# Patient Record
Sex: Male | Born: 1980 | Race: Black or African American | Hispanic: No | Marital: Single | Smoking: Never smoker
Health system: Southern US, Community
[De-identification: ages and names within clinical notes are randomized; demographics above are authoritative.]

## PROBLEM LIST (undated history)

## (undated) DIAGNOSIS — B259 Cytomegaloviral disease, unspecified: Secondary | ICD-10-CM

## (undated) DIAGNOSIS — D696 Thrombocytopenia, unspecified: Secondary | ICD-10-CM

## (undated) DIAGNOSIS — R161 Splenomegaly, not elsewhere classified: Secondary | ICD-10-CM

## (undated) DIAGNOSIS — B1910 Unspecified viral hepatitis B without hepatic coma: Secondary | ICD-10-CM

## (undated) HISTORY — DX: Cytomegaloviral disease, unspecified: B25.9

## (undated) HISTORY — DX: Unspecified viral hepatitis B without hepatic coma: B19.10

## (undated) HISTORY — DX: Thrombocytopenia, unspecified: D69.6

## (undated) HISTORY — DX: Splenomegaly, not elsewhere classified: R16.1

---

## 2015-04-26 ENCOUNTER — Ambulatory Visit: Payer: Self-pay | Admitting: Family Medicine

## 2015-04-29 ENCOUNTER — Ambulatory Visit: Payer: Medicaid Other | Attending: Family Medicine | Admitting: Family Medicine

## 2015-04-29 ENCOUNTER — Encounter: Payer: Self-pay | Admitting: Family Medicine

## 2015-04-29 ENCOUNTER — Other Ambulatory Visit: Payer: Self-pay | Admitting: Family Medicine

## 2015-04-29 VITALS — BP 101/61 | HR 86 | Temp 99.0°F | Resp 16 | Ht 71.0 in | Wt 156.0 lb

## 2015-04-29 DIAGNOSIS — J322 Chronic ethmoidal sinusitis: Secondary | ICD-10-CM | POA: Insufficient documentation

## 2015-04-29 DIAGNOSIS — M609 Myositis, unspecified: Secondary | ICD-10-CM | POA: Insufficient documentation

## 2015-04-29 DIAGNOSIS — R682 Dry mouth, unspecified: Secondary | ICD-10-CM | POA: Insufficient documentation

## 2015-04-29 DIAGNOSIS — IMO0001 Reserved for inherently not codable concepts without codable children: Secondary | ICD-10-CM

## 2015-04-29 DIAGNOSIS — B1910 Unspecified viral hepatitis B without hepatic coma: Secondary | ICD-10-CM | POA: Insufficient documentation

## 2015-04-29 DIAGNOSIS — Z131 Encounter for screening for diabetes mellitus: Secondary | ICD-10-CM

## 2015-04-29 DIAGNOSIS — M791 Myalgia: Secondary | ICD-10-CM

## 2015-04-29 DIAGNOSIS — G43909 Migraine, unspecified, not intractable, without status migrainosus: Secondary | ICD-10-CM | POA: Insufficient documentation

## 2015-04-29 DIAGNOSIS — Z0000 Encounter for general adult medical examination without abnormal findings: Secondary | ICD-10-CM | POA: Insufficient documentation

## 2015-04-29 DIAGNOSIS — B181 Chronic viral hepatitis B without delta-agent: Secondary | ICD-10-CM | POA: Diagnosis not present

## 2015-04-29 DIAGNOSIS — J0120 Acute ethmoidal sinusitis, unspecified: Secondary | ICD-10-CM | POA: Diagnosis not present

## 2015-04-29 DIAGNOSIS — K029 Dental caries, unspecified: Secondary | ICD-10-CM

## 2015-04-29 DIAGNOSIS — Z1159 Encounter for screening for other viral diseases: Secondary | ICD-10-CM

## 2015-04-29 LAB — COMPLETE METABOLIC PANEL WITH GFR
ALBUMIN: 3.6 g/dL (ref 3.6–5.1)
ALT: 37 U/L (ref 9–46)
AST: 38 U/L (ref 10–40)
Alkaline Phosphatase: 83 U/L (ref 40–115)
BUN: 9 mg/dL (ref 7–25)
CHLORIDE: 103 mmol/L (ref 98–110)
CO2: 28 mmol/L (ref 20–31)
Calcium: 8.9 mg/dL (ref 8.6–10.3)
Creat: 0.72 mg/dL (ref 0.60–1.35)
GFR, Est African American: >89 mL/min (ref >=60)
GFR, Est Non African American: >89 mL/min (ref >=60)
GLUCOSE: 106 mg/dL — AB (ref 65–99)
POTASSIUM: 4.9 mmol/L (ref 3.5–5.3)
SODIUM: 136 mmol/L (ref 135–146)
Total Bilirubin: 1.8 mg/dL — ABNORMAL HIGH (ref 0.2–1.2)
Total Protein: 7.3 g/dL (ref 6.1–8.1)

## 2015-04-29 LAB — HEPATITIS B SURFACE ANTIBODY, QUANTITATIVE: Hepatitis B-Post: 0 m[IU]/mL

## 2015-04-29 LAB — HIV ANTIBODY (ROUTINE TESTING W REFLEX): HIV: NONREACTIVE

## 2015-04-29 LAB — POCT GLYCOSYLATED HEMOGLOBIN (HGB A1C): HEMOGLOBIN A1C: 4.4

## 2015-04-29 LAB — HEPATITIS B SURFACE AG-PRE VC IMM ST: Hepatitis B Pre Screen: REACTIVE — AB

## 2015-04-29 LAB — HEPATITIS A ANTIBODY, IGM: HEP A IGM: NONREACTIVE

## 2015-04-29 MED ORDER — AMOXICILLIN 500 MG PO CAPS: 500.0000 mg | ORAL_CAPSULE | Freq: Three times a day (TID) | ORAL | Status: DC

## 2015-04-29 NOTE — Progress Notes (Signed)
Subjective:  Patient ID: Julian Cox, male    DOB: [birth year]  Age: 35 y.o. MRN: [id number]  CC: Establish Care; Migraine; Eye Pain; and Joint Pain   HPI Atem Wal easy 35 year old male [place] male who comes into the clinic stating he has the Flu. He informs me he self diagnosed himself due to symptoms of body aches, rhinorrhea, left-sided headache and eye pain which feels like "thumping" for the last 3 weeks but he never got tested or treated. He also complains of [place] and mild abdominal discomfort. He has had associated fever and chills which is subjective with no diarrhea, vomiting or constipation. Denies sore throat or postnasal drip but complains of dry mouth.  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 He received the flu shot in November 2016.  He would also like to be referred to a dentist due to the fact that with the filing fell out from one of his left upper premolars.  No outpatient prescriptions prior to visit.   No facility-administered medications prior to visit.    ROS Review of Systems  Constitutional: Negative for activity change and appetite change.  HENT: Positive for dental problem. Negative for sinus pressure and sore throat.   Eyes: Negative for visual disturbance.  Respiratory: Negative for cough, chest tightness and shortness of breath.   Cardiovascular: Negative for chest pain and leg swelling.  Gastrointestinal: Negative for abdominal pain, diarrhea, constipation and abdominal distention.  Endocrine: Negative.   Genitourinary: Negative for dysuria.  Musculoskeletal: Negative for myalgias and joint swelling.  Skin: Negative for rash.  Allergic/Immunologic: Negative.   Neurological: Negative for weakness, light-headedness and numbness.  Psychiatric/Behavioral: Negative for suicidal ideas and dysphoric mood.    Objective:  BP 101/61 mmHg  Pulse 86   Temp(Src) 99 F (37.2 C) (Oral)  Resp 16  Ht  (1.803 m)  Wt 156 lb (70.761 kg)  BMI 21.77 kg/m2  SpO2 99%  BP/Weight 04/29/2015  Systolic BP 101  Diastolic BP 61  Wt. (Lbs) 156  BMI 21.77      Physical Exam  Constitutional: He is oriented to person, place, and time. He appears well-developed and well-nourished.  Ill-looking  HENT:  Right Ear: External ear normal.  Left Ear: External ear normal.  Mouth/Throat: No oropharyngeal exudate.  Oropharynx and mouth appeared dry Left-sided frontal and maxillary sinus tenderness  Cardiovascular: Normal rate, normal heart sounds and intact distal pulses.   No murmur heard. Pulmonary/Chest: Effort normal and breath sounds normal. He has no wheezes. He has no rales. He exhibits no tenderness.  Abdominal: Soft. Bowel sounds are normal. He exhibits no distension and no mass. There is tenderness (mild right upper quadrant tenderness).  Musculoskeletal: Normal range of motion.  Neurological: He is alert and oriented to person, place, and time.     Assessment & Plan:   1. Diabetes mellitus screening A1c 4.4-normal - POCT A1C  2. Acute ethmoidal sinusitis, recurrence not specified Treated for sinusitis but we'll need to rule out influenza - amoxicillin (AMOXIL) 500 MG capsule; Take 1 capsule (500 mg total) by mouth 3 (three) times daily.  Dispense: 30 capsule; Refill: 0 - Influenza a and b  3. Chronic viral hepatitis B without delta agent and without coma (HCC) Will need to exclude acute hepatitis given current fatigue We'll also screen for HIV - Hepatitis B E Antigen - Hepatitis B surface ag-pre vc imm st -  Hepatitis B surface antibody - Hepatitis A Antibody, IGM - Cytomegalovirus antibody, IgG - CBC with Differential/Platelet - COMPLETE METABOLIC PANEL WITH GFR  4. Myalgia and myositis Advised to use Tylenol as needed. - Influenza a and b   Meds ordered this encounter  Medications  . amoxicillin (AMOXIL) 500 MG  capsule    Sig: Take 1 capsule (500 mg total) by mouth 3 (three) times daily.    Dispense:  30 capsule    Refill:  0    Follow-up: Return in about 2 weeks (around 05/13/2015) for follow up sinusitis.   Jaclyn Shaggy MD

## 2015-04-29 NOTE — Progress Notes (Signed)
Patient's here to est. care, and discuss recent Dx with hepatitis B.  Patient had the flu, slight fever.  Patient started having HA's and pain on left side of head with throbbing, constant pain in left eye, and some slight pain in right eye.  Patient agree to diabetes screening.

## 2015-04-29 NOTE — Patient Instructions (Signed)

## 2015-04-30 LAB — CBC WITH DIFFERENTIAL/PLATELET
BASOS ABS: 0 10*3/uL (ref 0.0–0.1)
BASOS PCT: 0 % (ref 0–1)
EOS ABS: 0.1 10*3/uL (ref 0.0–0.7)
Eosinophils Relative: 1 % (ref 0–5)
HCT: 40.5 % (ref 39.0–52.0)
HEMOGLOBIN: 14.8 g/dL (ref 13.0–17.0)
LYMPHS ABS: 0.8 10*3/uL (ref 0.7–4.0)
Lymphocytes Relative: 15 % (ref 12–46)
MCH: 28.8 pg (ref 26.0–34.0)
MCHC: 36.5 g/dL — ABNORMAL HIGH (ref 30.0–36.0)
MCV: 78.8 fL (ref 78.0–100.0)
MONOS PCT: 10 % (ref 3–12)
MPV: 9.9 fL (ref 8.6–12.4)
Monocytes Absolute: 0.6 10*3/uL (ref 0.1–1.0)
NEUTROS ABS: 4.1 10*3/uL (ref 1.7–7.7)
NEUTROS PCT: 74 % (ref 43–77)
PLATELETS: 62 10*3/uL — AB (ref 150–400)
RBC: 5.14 MIL/uL (ref 4.22–5.81)
RDW: 14.2 % (ref 11.5–15.5)
WBC: 5.6 10*3/uL (ref 4.0–10.5)

## 2015-04-30 LAB — INFLUENZA A AND B AG, IMMUNOASSAY
Influenza A Antigen: NOT DETECTED
Influenza B Antigen: NOT DETECTED

## 2015-04-30 LAB — CYTOMEGALOVIRUS ANTIBODY, IGG: Cytomegalovirus Ab-IgG: 2.6 U/mL — ABNORMAL HIGH (ref <0.60)

## 2015-05-01 ENCOUNTER — Telehealth: Payer: Self-pay

## 2015-05-01 LAB — HEPATITIS B E ANTIGEN: HEPATITIS BE ANTIGEN: NONREACTIVE

## 2015-05-01 NOTE — Telephone Encounter (Signed)
CMA called patient, patient verified name and DOB. Patient was given lab results including the negative flu results. Per Dr. Venetia Night, patient was transferred to the front for a follow-up visit. Patient verbalized that he understood, with no further questions.

## 2015-05-01 NOTE — Telephone Encounter (Signed)
-----   Message from Jaclyn Shaggy, MD sent at 05/01/2015  2:47 PM EST ----- Negative for HIV, platelet count is low, he tested positive for cytomegalovirus antibodies and does have hepatitis B. I will need him to schedule a follow-up visit so we can discuss his results further and any possible referral he may need.

## 2015-05-08 ENCOUNTER — Ambulatory Visit: Payer: Medicaid Other | Attending: Family Medicine | Admitting: Family Medicine

## 2015-05-08 ENCOUNTER — Encounter: Payer: Self-pay | Admitting: Family Medicine

## 2015-05-08 VITALS — BP 124/82 | HR 70 | Temp 97.7°F | Resp 15 | Ht 71.0 in | Wt 158.0 lb

## 2015-05-08 DIAGNOSIS — R51 Headache: Secondary | ICD-10-CM | POA: Diagnosis not present

## 2015-05-08 DIAGNOSIS — B258 Other cytomegaloviral diseases: Secondary | ICD-10-CM | POA: Diagnosis not present

## 2015-05-08 DIAGNOSIS — D696 Thrombocytopenia, unspecified: Secondary | ICD-10-CM | POA: Insufficient documentation

## 2015-05-08 DIAGNOSIS — B181 Chronic viral hepatitis B without delta-agent: Secondary | ICD-10-CM | POA: Diagnosis not present

## 2015-05-08 DIAGNOSIS — J019 Acute sinusitis, unspecified: Secondary | ICD-10-CM | POA: Insufficient documentation

## 2015-05-08 DIAGNOSIS — B259 Cytomegaloviral disease, unspecified: Secondary | ICD-10-CM | POA: Insufficient documentation

## 2015-05-08 NOTE — Progress Notes (Signed)
Patient states he feels better Reports mild headache today and has 1 more antibiotic pill to take

## 2015-05-08 NOTE — Progress Notes (Signed)
Subjective:  Patient ID: Julian Cox, male    DOB: Jul 10, 1980  Age: 35 y.o. MRN: [id number]  CC: Follow-up   HPI Julian Cox is a 35 year old male originally from [place] with a history of chronic hepatitis B infection, cytomegalovirus infection who was treated for acute sinusitis with amoxicillin and is currently taking his last dose today. He just has a mild residual headache at this time.  His blood work revealed severe thrombocytopenia which the patient was previously unaware of. He also informs me he received treatment for hepatitis B while in [place] for about 2 months but does not have any further details. He will need a note to return to work as he has been out of work due to his illness.  Outpatient Prescriptions Prior to Visit  Medication Sig Dispense Refill  . amoxicillin (AMOXIL) 500 MG capsule Take 1 capsule (500 mg total) by mouth 3 (three) times daily. 30 capsule 0   No facility-administered medications prior to visit.    ROS Review of Systems  Constitutional: Negative for activity change and appetite change.  HENT: Negative for sinus pressure and sore throat.   Eyes: Negative for visual disturbance.  Respiratory: Negative for cough, chest tightness and shortness of breath.   Cardiovascular: Negative for chest pain and leg swelling.  Gastrointestinal: Negative for abdominal pain, diarrhea, constipation and abdominal distention.  Endocrine: Negative.   Genitourinary: Negative for dysuria.  Musculoskeletal: Negative for myalgias and joint swelling.  Skin: Negative for rash.  Allergic/Immunologic: Negative.   Neurological: Positive for headaches. Negative for weakness, light-headedness and numbness.  Psychiatric/Behavioral: Negative for suicidal ideas and dysphoric mood.    Objective:  BP 124/82 mmHg  Pulse 70  Temp(Src) 97.7 F (36.5 C)  Resp 15  Ht  (1.803 m)  Wt 158 lb (71.668 kg)  BMI 22.05 kg/m2  SpO2 100%  BP/Weight 05/08/2015 04/29/2015  Systolic BP  124 621  Diastolic BP 82 61  Wt. (Lbs) 158 156  BMI 22.05 21.77      Physical Exam  Constitutional: He is oriented to person, place, and time. He appears well-developed and well-nourished.  Cardiovascular: Normal rate, normal heart sounds and intact distal pulses.   No murmur heard. Pulmonary/Chest: Effort normal and breath sounds normal. He has no wheezes. He has no rales. He exhibits no tenderness.  Abdominal: Soft. Bowel sounds are normal. He exhibits no distension and no mass. There is no tenderness.  Musculoskeletal: Normal range of motion.  Neurological: He is alert and oriented to person, place, and time.    CMP Latest Ref Rng 04/29/2015  Glucose 65 - 99 mg/dL 308(M)  BUN 7 - 25 mg/dL 9  Creatinine 5.78 - 4.69 mg/dL 6.29  Sodium 528 - 413 mmol/L 136  Potassium 3.5 - 5.3 mmol/L 4.9  Chloride 98 - 110 mmol/L 103  CO2 20 - 31 mmol/L 28  Calcium 8.6 - 10.3 mg/dL 8.9  Total Protein 6.1 - 8.1 g/dL 7.3  Total Bilirubin 0.2 - 1.2 mg/dL 2.4(M)  Alkaline Phos 40 - 115 U/L 83  AST 10 - 40 U/L 38  ALT 9 - 46 U/L 37     CBC    Component Value Date/Time   WBC 5.6 04/29/2015 1632   RBC 5.14 04/29/2015 1632   HGB 14.8 04/29/2015 1632   HCT 40.5 04/29/2015 1632   PLT 62* 04/29/2015 1632   MCV 78.8 04/29/2015 1632   MCH 28.8 04/29/2015 1632   MCHC 36.5* 04/29/2015 1632   RDW 14.2  04/29/2015 1632   LYMPHSABS 0.8 04/29/2015 1632   MONOABS 0.6 04/29/2015 1632   EOSABS 0.1 04/29/2015 1632   BASOSABS 0.0 04/29/2015 1632     Assessment & Plan:   1. 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 Thrombocytopenia (HCC) He has no bleeding at this time. We'll need to evaluate for ITP. - Ambulatory referral to Hematology   No orders of the defined types were placed in this encounter.    Follow-up: Return in about 3 months (around 08/05/2015) for Coordination of care.   Jaclyn Shaggy MD

## 2015-05-13 ENCOUNTER — Telehealth: Payer: Self-pay | Admitting: Hematology

## 2015-05-13 NOTE — Telephone Encounter (Signed)
Pt is aware of np appt on 05/23/15@1 :30 Referring Dr. Venetia Night DX_Thrombocytopenia

## 2015-05-23 ENCOUNTER — Ambulatory Visit (HOSPITAL_BASED_OUTPATIENT_CLINIC_OR_DEPARTMENT_OTHER): Payer: Medicaid Other | Admitting: Hematology

## 2015-05-23 ENCOUNTER — Ambulatory Visit (HOSPITAL_BASED_OUTPATIENT_CLINIC_OR_DEPARTMENT_OTHER): Payer: Medicaid Other

## 2015-05-23 ENCOUNTER — Encounter: Payer: Self-pay | Admitting: Hematology

## 2015-05-23 ENCOUNTER — Telehealth: Payer: Self-pay | Admitting: Hematology

## 2015-05-23 VITALS — BP 116/62 | HR 66 | Temp 97.9°F | Resp 17 | Ht 71.0 in | Wt 151.7 lb

## 2015-05-23 DIAGNOSIS — R161 Splenomegaly, not elsewhere classified: Secondary | ICD-10-CM

## 2015-05-23 DIAGNOSIS — Z8619 Personal history of other infectious and parasitic diseases: Secondary | ICD-10-CM | POA: Diagnosis not present

## 2015-05-23 DIAGNOSIS — D696 Thrombocytopenia, unspecified: Secondary | ICD-10-CM

## 2015-05-23 DIAGNOSIS — B1910 Unspecified viral hepatitis B without hepatic coma: Secondary | ICD-10-CM

## 2015-05-23 LAB — CBC & DIFF AND RETIC
BASO%: 0 % (ref 0.0–2.0)
Basophils Absolute: 0 10e3/uL (ref 0.0–0.1)
EOS%: 5.5 % (ref 0.0–7.0)
Eosinophils Absolute: 0.1 10e3/uL (ref 0.0–0.5)
HCT: 42.3 % (ref 38.4–49.9)
HGB: 15.1 g/dL (ref 13.0–17.1)
Immature Retic Fract: 2.9 % — ABNORMAL LOW (ref 3.00–10.60)
LYMPH%: 39.8 % (ref 14.0–49.0)
MCH: 28.8 pg (ref 27.2–33.4)
MCHC: 35.7 g/dL (ref 32.0–36.0)
MCV: 80.6 fL (ref 79.3–98.0)
MONO#: 0.2 10e3/uL (ref 0.1–0.9)
MONO%: 6.4 % (ref 0.0–14.0)
NEUT#: 1.1 10e3/uL — ABNORMAL LOW (ref 1.5–6.5)
NEUT%: 48.3 % (ref 39.0–75.0)
Platelets: 57 10e3/uL — ABNORMAL LOW (ref 140–400)
RBC: 5.25 10e6/uL (ref 4.20–5.82)
RDW: 14.8 % — ABNORMAL HIGH (ref 11.0–14.6)
Retic %: 1.33 % (ref 0.80–1.80)
Retic Ct Abs: 69.83 10e3/uL (ref 34.80–93.90)
WBC: 2.4 10e3/uL — ABNORMAL LOW (ref 4.0–10.3)
lymph#: 0.9 10e3/uL (ref 0.9–3.3)

## 2015-05-23 LAB — PROTIME-INR
INR: 1.4 — ABNORMAL LOW (ref 2.00–3.50)
Protime: 16.8 Seconds — ABNORMAL HIGH (ref 10.6–13.4)

## 2015-05-23 LAB — COMPREHENSIVE METABOLIC PANEL WITH GFR
ALT: 31 U/L (ref 0–55)
AST: 35 U/L — ABNORMAL HIGH (ref 5–34)
Albumin: 3.7 g/dL (ref 3.5–5.0)
Alkaline Phosphatase: 102 U/L (ref 40–150)
Anion Gap: 7 meq/L (ref 3–11)
BUN: 8.1 mg/dL (ref 7.0–26.0)
CO2: 27 meq/L (ref 22–29)
Calcium: 9.2 mg/dL (ref 8.4–10.4)
Chloride: 106 meq/L (ref 98–109)
Creatinine: 0.8 mg/dL (ref 0.7–1.3)
EGFR: >90 ml/min/1.73 m2
Glucose: 79 mg/dL (ref 70–140)
Potassium: 4.2 meq/L (ref 3.5–5.1)
Sodium: 139 meq/L (ref 136–145)
Total Bilirubin: 1.89 mg/dL — ABNORMAL HIGH (ref 0.20–1.20)
Total Protein: 8.2 g/dL (ref 6.4–8.3)

## 2015-05-23 LAB — LACTATE DEHYDROGENASE: LDH: 156 U/L (ref 125–245)

## 2015-05-23 LAB — CHCC SMEAR

## 2015-05-23 NOTE — Telephone Encounter (Signed)
per pof to sch pt appt-sent back ot lab-gave avs-reerral to Inf Disease intheir workque-adv they willc all ot sch appt-adv centrla Sch will call to sch [place]

## 2015-05-24 LAB — HEPATITIS B DNA, ULTRAQUANTITATIVE, PCR: HBV DNA SERPL PCR-ACNC: <10 IU/mL

## 2015-05-24 LAB — HEPATITIS C ANTIBODY

## 2015-05-24 LAB — APTT: aPTT: 35 s — ABNORMAL HIGH (ref 24–33)

## 2015-05-24 LAB — FERRITIN: Ferritin: 25 ng/ml (ref 22–316)

## 2015-06-02 ENCOUNTER — Encounter: Payer: Self-pay | Admitting: Hematology

## 2015-06-02 NOTE — Progress Notes (Signed)
[facility]    HEMATOLOGY/ONCOLOGY CONSULTATION NOTE  Date of Service: .05/23/2015  PCP : Jaclyn Shaggy, MD   CHIEF COMPLAINTS/PURPOSE OF CONSULTATION:  Thrombocytopenia  HISTORY OF PRESENTING ILLNESS:   Julian Cox is a wonderful 35 y.o. male from [place] who has been referred to [place] by Dr Jaclyn Shaggy, MD for evaluation and management of thrombocytopenia.  Patient is a very pleasant 35 year old African male from [place] with a history of hepatitis B diagnosed in 2010 when he was in [place].  He notes that he was treated with one medication and some vitamins.  He was diagnosed when he presented with some abdominal discomfort and an enlarged spleen.  He notes that subsequently several members of his family were also diagnosed with hepatitis B.  He notes that he is having low platelets in the past but never as low as he was told recently.  Never his knee issues with bleeding previously.  Patient notes that he recently had an acute sinusitis about a month ago and was treated with amoxicillin for about 10 days.  He has not been taking any other medications. Recently established a primary care physician and had labs that showed a thrombocytopenia resulting in his referral. His CBC on 04/19/2015 showed a platelet count of 62k  With normal hemoglobin and WBC count.  CBC today shows a platelet count of 57k with normal hemoglobin of 15.1 and a little WBC count of 2.4k with an ANC of 1.1k  He has been having some mild epistaxis.  No other overt bleeding noted.  No petechiae.  No ecchymosis.  No blood in the stools.  No hematuria.  No other overt bleeding.  Does not report using any alcohol or over-the-counter medications. He did report a slightly enlarged left axillary lymph node which he notes disappeared by itself without any specific treatment.this is currently not palpable.   MEDICAL HISTORY:  Past Medical History  Diagnosis Date  . Hepatitis B     diagnosed and treated in [place] in 2010  .  CMV (cytomegalovirus infection) (HCC)   . Thrombocytopenia (HCC)   . Splenomegaly     SURGICAL HISTORY: History reviewed. No pertinent past surgical history.  SOCIAL HISTORY: Social History   Social History  . Marital Status: Single    Spouse Name: N/A  . Number of Children: N/A  . Years of Education: N/A   Occupational History  . Not on file.   Social History Main Topics  . Smoking status: Never Smoker   . Smokeless tobacco: Not on file  . Alcohol Use: No  . Drug Use: No  . Sexual Activity: Not on file   Other Topics Concern  . 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  FAMILY HISTORY:  3 sisters, one cousin and maternal uncle brother all with hepatitis B or liver disease without known hepatitis B.  ALLERGIES:  has No Known Allergies.  MEDICATIONS:  No current outpatient prescriptions on file.   No current facility-administered medications for this visit.    REVIEW OF SYSTEMS:    10 Point review of Systems was done is negative except as noted above.  PHYSICAL EXAMINATION: ECOG PERFORMANCE STATUS: 0 - Asymptomatic  . Filed Vitals:   05/23/15 1335  BP: 116/62  Pulse: 66  Temp: 97.9 F (36.6 C)  Resp: 17   Filed Weights   05/23/15 1335  Weight: 151 lb  11.2 oz (68.811 kg)   .Body mass index is 21.17 kg/(m^2).  GENERAL:alert, in no acute distress and comfortable SKIN: skin color, texture, turgor are normal, no rashes or significant lesions EYES: normal, conjunctiva are pink and non-injected, sclera clear OROPHARYNX:no exudate, no erythema and lips, buccal mucosa, and tongue normal  NECK: supple, no JVD, thyroid normal size, non-tender, without nodularity LYMPH:  no palpable lymphadenopathy in the cervical, axillary or inguinal LUNGS: clear to auscultation with normal respiratory effort HEART: regular rate & rhythm,  no murmurs and no lower  extremity edema ABDOMEN: abdomen soft, non-tender, normoactive bowel sounds , palpable splenomegaly about 3-4 fingerbreadths below costal margin in the midclavicular line on the left side. Musculoskeletal: no cyanosis of digits and no clubbing  PSYCH: alert & oriented x 3 with fluent speech NEURO: no focal motor/sensory deficits  LABORATORY DATA:  I have reviewed the data as listed  . CBC Latest Ref Rng 05/23/2015 04/29/2015  WBC 4.0 - 10.3 10e3/uL 2.4(L) 5.6  Hemoglobin 13.0 - 17.1 g/dL 16.115.1 09.614.8  Hematocrit 04.538.4 - 49.9 % 42.3 40.5  Platelets 140 - 400 10e3/uL 57(L) 62(L)    . CMP Latest Ref Rng 05/23/2015 04/29/2015  Glucose 70 - 140 mg/dl 79 409(W106(H)  BUN 7.0 - 11.926.0 mg/dL 8.1 9  Creatinine 0.7 - 1.3 mg/dL 0.8 1.470.72  Sodium 829136 - 145 mEq/L 139 136  Potassium 3.5 - 5.1 mEq/L 4.2 4.9  Chloride 98 - 110 mmol/L - 103  CO2 22 - 29 mEq/L 27 28  Calcium 8.4 - 10.4 mg/dL 9.2 8.9  Total Protein 6.4 - 8.3 g/dL 8.2 7.3  Total Bilirubin 0.20 - 1.20 mg/dL 5.62(Z1.89(H) 3.0(Q1.8(H)  Alkaline Phos 40 - 150 U/L 102 83  AST 5 - 34 U/L 35(H) 38  ALT 0 - 55 U/L 31 37   Component     Latest Ref Rng 04/29/2015 05/23/2015  HBV DNA SERPL PCR-ACNC       <10  HBV DNA SERPL PCR-LOG IU       Test Not Performed.  Test Information       Comment  Hepatitis Be Antigen     NON-REACTIVE NON-REACTIVE   Hepatitis B Pre Screen     NON REACTIVE REACTIVE (A)   Hepatitis B-Post      0.0   Hep A Ab, IgM     NON REACTIVE NON REACTIVE   HIV     NONREACTIVE NONREACTIVE   Hep C Virus Ab     0.0 - 0.9 s/co ratio  <0.1  LDH     125 - 245 U/L  156  Ferritin     22 - 316 ng/ml  25   Peripheral blood smear reviewed by me -decreased platelets.  No platelet clumping. RBCs show tendency towards microcytosis.  No increased schistocytes.  No increased blasts.    RADIOGRAPHIC STUDIES: I have personally reviewed the radiological images as listed and agreed with the findings in the report. No results found.  ASSESSMENT &  PLAN:   35 year old male with  #1 thrombocytopenia in the 50-60k range in the setting of splenomegaly and history of hepatitis B infection.  Patient reports some thrombocytopenia in the past.  This could reflect increasing splenomegaly with worsening platelet counts. The fact that he recently had a bad sinus infection and has been recent counts have also been more than a month ago suggests that he might have some additional drop in platelet counts due to passing viral infection. Has some mild epistaxis but  no other overt bleeding at this time.  HIV negative, hepatitis C negative Plan -No overt indication for treatment to the patient's thrombocytopenia at this time. -We'll get an ultrasound of the abdomen to evaluate spleen size and liver  #2 history of hepatitis B - hepatitis B surface antigen positive.  Hepatitis B viral load is undetectable at this time.   Has had treatment in 2010. Plan -We will admit the patient to the hepatitis clinic for continued management of this.  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  Ferritin level within normal limits. -We will evaluate his liver for sclerotic changes based on ultrasound.  Pertinent care with Dr. Candise Che in 4 weeks with [place] abd and with rpt labs  All of the patients questions were answered with apparent satisfaction. The patient knows to call the clinic with any problems, questions or concerns.  I spent 60 minutes counseling the patient face to face. The total time spent in the appointment was 60 minutes and more than 50% was on counseling and direct patient cares.    Wyvonnia Lora MD MS AAHIVMS [facility] Hematology/Oncology Physician [facility]  (Office):       [phone] (Work cell):  [phone] (Fax):           [phone]  06/02/2015 5:45 PM

## 2015-06-14 ENCOUNTER — Ambulatory Visit (HOSPITAL_COMMUNITY)
Admission: RE | Admit: 2015-06-14 | Discharge: 2015-06-14 | Disposition: A | Discharge status: Home / Self Care | Payer: Medicaid Other | Source: Ambulatory Visit | Attending: Hematology | Admitting: Hematology

## 2015-06-14 DIAGNOSIS — R161 Splenomegaly, not elsewhere classified: Secondary | ICD-10-CM | POA: Diagnosis present

## 2015-06-14 DIAGNOSIS — D696 Thrombocytopenia, unspecified: Secondary | ICD-10-CM | POA: Insufficient documentation

## 2015-06-14 DIAGNOSIS — K769 Liver disease, unspecified: Secondary | ICD-10-CM | POA: Diagnosis not present

## 2015-06-20 ENCOUNTER — Encounter: Payer: Self-pay | Admitting: Hematology

## 2015-06-20 ENCOUNTER — Other Ambulatory Visit (HOSPITAL_BASED_OUTPATIENT_CLINIC_OR_DEPARTMENT_OTHER): Payer: Medicaid Other

## 2015-06-20 ENCOUNTER — Telehealth: Payer: Self-pay | Admitting: Hematology

## 2015-06-20 ENCOUNTER — Ambulatory Visit (HOSPITAL_BASED_OUTPATIENT_CLINIC_OR_DEPARTMENT_OTHER): Payer: Medicaid Other | Admitting: Hematology

## 2015-06-20 VITALS — BP 124/61 | HR 74 | Temp 98.1°F | Resp 20 | Ht 71.0 in | Wt 158.2 lb

## 2015-06-20 DIAGNOSIS — R161 Splenomegaly, not elsewhere classified: Secondary | ICD-10-CM | POA: Diagnosis not present

## 2015-06-20 DIAGNOSIS — D696 Thrombocytopenia, unspecified: Secondary | ICD-10-CM

## 2015-06-20 DIAGNOSIS — D500 Iron deficiency anemia secondary to blood loss (chronic): Secondary | ICD-10-CM

## 2015-06-20 DIAGNOSIS — B1910 Unspecified viral hepatitis B without hepatic coma: Secondary | ICD-10-CM

## 2015-06-20 LAB — CBC & DIFF AND RETIC
BASO%: 0.4 % (ref 0.0–2.0)
Basophils Absolute: 0 10*3/uL (ref 0.0–0.1)
EOS ABS: 0.1 10*3/uL (ref 0.0–0.5)
EOS%: 4.5 % (ref 0.0–7.0)
HCT: 38.5 % (ref 38.4–49.9)
HEMOGLOBIN: 13.9 g/dL (ref 13.0–17.1)
IMMATURE RETIC FRACT: 5.9 % (ref 3.00–10.60)
LYMPH#: 0.4 10*3/uL — AB (ref 0.9–3.3)
LYMPH%: 14.4 % (ref 14.0–49.0)
MCH: 28.9 pg (ref 27.2–33.4)
MCHC: 36.1 g/dL — ABNORMAL HIGH (ref 32.0–36.0)
MCV: 80 fL (ref 79.3–98.0)
MONO#: 0.2 10*3/uL (ref 0.1–0.9)
MONO%: 9.5 % (ref 0.0–14.0)
NEUT%: 71.2 % (ref 39.0–75.0)
NEUTROS ABS: 1.7 10*3/uL (ref 1.5–6.5)
PLATELETS: 54 10*3/uL — AB (ref 140–400)
RBC: 4.81 10*6/uL (ref 4.20–5.82)
RDW: 15.2 % — ABNORMAL HIGH (ref 11.0–14.6)
RETIC CT ABS: 99.57 10*3/uL — AB (ref 34.80–93.90)
Retic %: 2.07 % — ABNORMAL HIGH (ref 0.80–1.80)
WBC: 2.4 10*3/uL — ABNORMAL LOW (ref 4.0–10.3)

## 2015-06-20 LAB — COMPREHENSIVE METABOLIC PANEL
ALBUMIN: 3.3 g/dL — AB (ref 3.5–5.0)
ALK PHOS: 126 U/L (ref 40–150)
ALT: 33 U/L (ref 0–55)
AST: 38 U/L — ABNORMAL HIGH (ref 5–34)
Anion Gap: 5 mEq/L (ref 3–11)
BILIRUBIN TOTAL: 1.9 mg/dL — AB (ref 0.20–1.20)
BUN: 8.1 mg/dL (ref 7.0–26.0)
CO2: 26 meq/L (ref 22–29)
CREATININE: 0.8 mg/dL (ref 0.7–1.3)
Calcium: 8.7 mg/dL (ref 8.4–10.4)
Chloride: 108 mEq/L (ref 98–109)
GLUCOSE: 87 mg/dL (ref 70–140)
Potassium: 4.5 mEq/L (ref 3.5–5.1)
SODIUM: 139 meq/L (ref 136–145)
TOTAL PROTEIN: 7.3 g/dL (ref 6.4–8.3)

## 2015-06-20 NOTE — Telephone Encounter (Signed)
per pof to sch pt appt-gave pt copy of avs °

## 2015-06-24 NOTE — Progress Notes (Signed)
Marland Kitchen.    HEMATOLOGY/ONCOLOGY CONSULTATION NOTE  Date of Service: .06/20/2015  PCP : Jaclyn ShaggyEnobong Amao, MD   CHIEF COMPLAINTS/PURPOSE OF CONSULTATION:  Thrombocytopenia  HISTORY OF PRESENTING ILLNESS:   Julian Cox is a wonderful 35 y.o. male from [place] who has been referred to us by Dr Jaclyn ShaggyEnobong Amao, MD for evaluation and management of thrombocytopenia.  Patient is a very pleasant 35 year old African male from [place] with a history of hepatitis B diagnosed in 2010 when he was in [place].  He notes that he was treated with one medication and some vitamins.  He was diagnosed when he presented with some abdominal discomfort and an enlarged spleen.  He notes that subsequently several members of his family were also diagnosed with hepatitis B.  He notes that he is having low platelets in the past but never as low as he was told recently.  Never his knee issues with bleeding previously.  Patient notes that he recently had an acute sinusitis about a month ago and was treated with amoxicillin for about 10 days.  He has not been taking any other medications. Recently established a primary care physician and had labs that showed a thrombocytopenia resulting in his referral. His CBC on 04/19/2015 showed a platelet count of 62k  With normal hemoglobin and WBC count.  CBC today shows a platelet count of 57k with normal hemoglobin of 15.1 and a little WBC count of 2.4k with an ANC of 1.1k  He has been having some mild epistaxis.  No other overt bleeding noted.  No petechiae.  No ecchymosis.  No blood in the stools.  No hematuria.  No other overt bleeding.  Does not report using any alcohol or over-the-counter medications. He did report a slightly enlarged left axillary lymph node which he notes disappeared by itself without any specific treatment.this is currently not palpable.  INTERVAL HISTORY  [name] is here for followup regarding his thrombocytopenia.  His platelet counts remain stable in the  50,000 range.  He notes minimal epistaxis with sneezing and clearing his nose but not significant. 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  His ultrasound of the abdomen did not show overt cirrhotic changes but showed massive splenomegaly measuring 24 cm.  We discussed the results and the fact that his hypersplenism most likely the etiology of his  Thrombocytopenia at this time. He is seeing Dr. Ninetta LightsHatcher from infectious disease for management of his hepatitis B. Hepatitis B DNA less than 10 international units per mL when checked on 05/23/2015. he notes that the sinus infection has now resolved.  MEDICAL HISTORY:  Past Medical History  Diagnosis Date  . Hepatitis B     diagnosed and treated in [place] in 2010  . CMV (cytomegalovirus infection) (HCC)   . Thrombocytopenia (HCC)   . Splenomegaly     SURGICAL HISTORY: History reviewed. No pertinent past surgical history.  SOCIAL HISTORY: Social History   Social History  . Marital Status: Single    Spouse Name: N/A  . Number of Children: N/A  . Years of Education: N/A   Occupational History  . Not on file.   Social History Main Topics  . Smoking status: Never Smoker   . Smokeless tobacco: Not on file  . Alcohol Use: No  . Drug Use: No  . Sexual Activity: Not on file   Other Topics Concern  . 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  FAMILY HISTORY:  3 sisters, one cousin and maternal uncle brother all with hepatitis B or liver disease without known hepatitis B.  ALLERGIES:  has No Known Allergies.  MEDICATIONS:  No current outpatient prescriptions on file.   No current facility-administered medications for this visit.    REVIEW OF SYSTEMS:    10 Point review of Systems was done is negative except as noted above.  PHYSICAL EXAMINATION: ECOG PERFORMANCE  STATUS: 0 - Asymptomatic  . Filed Vitals:   06/20/15 1312  BP: 124/61  Pulse: 74  Temp: 98.1 F (36.7 C)  Resp: 20   Filed Weights   06/20/15 1312  Weight: 158 lb 3.2 oz (71.759 kg)   .Body mass index is 22.07 kg/(m^2).  GENERAL:alert, in no acute distress and comfortable SKIN: skin color, texture, turgor are normal, no rashes or significant lesions EYES: normal, conjunctiva are pink and non-injected, sclera clear OROPHARYNX:no exudate, no erythema and lips, buccal mucosa, and tongue normal  NECK: supple, no JVD, thyroid normal size, non-tender, without nodularity LYMPH:  no palpable lymphadenopathy in the cervical, axillary or inguinal LUNGS: clear to auscultation with normal respiratory effort HEART: regular rate & rhythm,  no murmurs and no lower extremity edema ABDOMEN: abdomen soft, non-tender, normoactive bowel sounds , palpable significant splenomegaly. Musculoskeletal: no cyanosis of digits and no clubbing  PSYCH: alert & oriented x 3 with fluent speech NEURO: no focal motor/sensory deficits  LABORATORY DATA:  I have reviewed the data as listed  . CBC Latest Ref Rng 06/20/2015 05/23/2015 04/29/2015  WBC 4.0 - 10.3 10e3/uL 2.4(L) 2.4(L) 5.6  Hemoglobin 13.0 - 17.1 g/dL 40.9 81.1 91.4  Hematocrit 38.4 - 49.9 % 38.5 42.3 40.5  Platelets 140 - 400 10e3/uL 54(L) 57(L) 62(L)    . CMP Latest Ref Rng 06/20/2015 05/23/2015 04/29/2015  Glucose 70 - 140 mg/dl 87 79 782(N)  BUN 7.0 - 26.0 mg/dL 8.1 8.1 9  Creatinine 0.7 - 1.3 mg/dL 0.8 0.8 5.62  Sodium 130 - 145 mEq/L 139 139 136  Potassium 3.5 - 5.1 mEq/L 4.5 4.2 4.9  Chloride 98 - 110 mmol/L - - 103  CO2 22 - 29 mEq/L Calcium 8.4 - 10.4 mg/dL 8.7 9.2 8.9  Total Protein 6.4 - 8.3 g/dL 7.3 8.2 7.3  Total Bilirubin 0.20 - 1.20 mg/dL 8.65(H) 8.46(N) 6.2(X)  Alkaline Phos 40 - 150 U/L 126 102 83  AST 5 - 34 U/L 38(H) 35(H) 38  ALT 0 - 55 U/L 33 31 37   Component     Latest Ref Rng 04/29/2015 05/23/2015  HBV DNA  SERPL PCR-ACNC       <10  HBV DNA SERPL PCR-LOG IU       Test Not Performed.  Test Information       Comment  Hepatitis Be Antigen     NON-REACTIVE NON-REACTIVE   Hepatitis B Pre Screen     NON REACTIVE REACTIVE (A)   Hepatitis B-Post      0.0   Hep A Ab, IgM     NON REACTIVE NON REACTIVE   HIV     NONREACTIVE NONREACTIVE   Hep C Virus Ab     0.0 - 0.9 s/co ratio  <0.1  LDH     125 - 245 U/L  156  Ferritin     22 - 316 ng/ml  25   Peripheral blood smear  reviewed by me -decreased platelets.  No platelet clumping. RBCs show tendency towards microcytosis.  No increased schistocytes.  No increased blasts.    RADIOGRAPHIC STUDIES: I have personally reviewed the radiological images as listed and agreed with the findings in the report. US Abdomen Complete  06/14/2015  CLINICAL DATA:  Hepatitis-B, thrombocytopenia EXAM: ABDOMEN ULTRASOUND COMPLETE COMPARISON:  None. FINDINGS: Gallbladder: No gallstones or wall thickening visualized. No sonographic Murphy sign noted by sonographer. Common bile duct: Diameter: Normal caliber, 2 mm Liver: Heterogeneous echotexture which could reflect fatty infiltration. Slightly hypoechoic area measuring 1.7 cm likely reflects an area of focal fatty sparing. IVC: No abnormality visualized. Pancreas: Visualized portion unremarkable. Spleen: Enlarged with a craniocaudal length of 24 cm in a splenic volume of 2100 mL Right Kidney: Length: 11.2 cm. Echogenicity within normal limits. No mass or hydronephrosis visualized. Left Kidney: Length: 11.8 cm. Echogenicity within normal limits. No mass or hydronephrosis visualized. Abdominal aorta: No aneurysm visualized. Other findings: None. IMPRESSION: Marked splenomegaly. Heterogeneous echotexture throughout the liver, likely fatty infiltration with area of focal fatty sparing. Electronically Signed   By: Charlett Nose M.D.   On: 06/14/2015 09:36    ASSESSMENT & PLAN:   35 year old male with  #1 thrombocytopenia in  the 50-60k range in the setting of splenomegaly and history of hepatitis B infection.  Patient reports some thrombocytopenia in the past.  This could reflect increasing splenomegaly with worsening platelet counts. The fact that he recently had a bad sinus infection and has been recent counts have also been more than a month ago suggests that he might have some additional drop in platelet counts due to passing viral infection. Has some mild epistaxis but no other overt bleeding at this time.  HIV negative, hepatitis C negative [place] abd -showed marked splenomegaly 24 cm with a volume of 2100 mL. Splenomegaly could be likely be multifactorial including with chronic malaria, schistosomiasis, hepatitis B, venous congestion from liver disease. Plan -we discussed the significance of his massive splenomegaly as it pertains to his thrombocytopenia, abdominal discomfort, risk of spontaneous and traumatic splenic rupture. -I discussed with him the option of considering splenectomy for treatment of his thrombocytopenia if needed, to reduce the risk of spontaneous splenic rupture and his if he has abdominal discomfort that could be from a large spleen. -I gave him reading material and educational material about the pros and cons of splenectomy, need for vaccinations.  He notes that he would like to think about it and shall let us know what he decides. -He was recommended to avoid any contact sports or other activities that might cause abdominal trauma -No overt indication for treatment to the patient's thrombocytopenia at this time. If the patient has worsening platelet counts below 30,000 or issues with significant bleeding would consider the possible use of Romiplostim. -Patient is following with Dr. Ninetta Lights from infectious disease for his hepatitis B.  But also have an weight in on other possible infectious causes that might be related to his splenomegaly and that can be treated alternatively.  #2 history of  hepatitis B - hepatitis B surface antigen positive.  Hepatitis B viral load is undetectable at this time.   Has had treatment in 2010. Plan -patient has been referred to the hepatitis clinic to Dr Ninetta Lights for continued management of this.  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  Ferritin level within normal limits. No overt cirrhotic changes on [place] abd though we did not perform elastography.  Patient  reports that he did have a liver biopsy in [place] when he was initially diagnosed and was noted to have some fibrosis on his biopsy.  Continue close followup with primary care physician and infectious disease.  Return to care with Dr. Candise Che in 2 months with repeat labs.  Earlier if any new concerns.  All of the patients questions were answered with apparent satisfaction. The patient knows to call the clinic with any problems, questions or concerns.  I spent 25 minutes counseling the patient face to face. The total time spent in the appointment was 25 minutes and more than 50% was on counseling and direct patient cares.    Wyvonnia Lora MD MS AAHIVMS [facility] Hematology/Oncology [facility]  (Office):       [phone] (Work cell):  [phone] (Fax):           [phone]

## 2015-06-26 ENCOUNTER — Encounter: Payer: Self-pay | Admitting: Infectious Diseases

## 2015-06-26 ENCOUNTER — Ambulatory Visit (INDEPENDENT_AMBULATORY_CARE_PROVIDER_SITE_OTHER): Payer: Medicaid Other | Admitting: Infectious Diseases

## 2015-06-26 DIAGNOSIS — B181 Chronic viral hepatitis B without delta-agent: Secondary | ICD-10-CM | POA: Diagnosis not present

## 2015-06-26 LAB — CBC WITH DIFFERENTIAL/PLATELET
BASOS ABS: 0 10*3/uL (ref 0.0–0.1)
BASOS PCT: 0 % (ref 0–1)
EOS ABS: 0.1 10*3/uL (ref 0.0–0.7)
EOS PCT: 5 % (ref 0–5)
HCT: 39.6 % (ref 39.0–52.0)
Hemoglobin: 14.1 g/dL (ref 13.0–17.0)
Lymphocytes Relative: 40 % (ref 12–46)
Lymphs Abs: 0.7 10*3/uL (ref 0.7–4.0)
MCH: 28 pg (ref 26.0–34.0)
MCHC: 35.6 g/dL (ref 30.0–36.0)
MCV: 78.7 fL (ref 78.0–100.0)
MONOS PCT: 10 % (ref 3–12)
MPV: 11.1 fL (ref 8.6–12.4)
Monocytes Absolute: 0.2 10*3/uL (ref 0.1–1.0)
NEUTROS PCT: 45 % (ref 43–77)
Neutro Abs: 0.8 10*3/uL — ABNORMAL LOW (ref 1.7–7.7)
PLATELETS: 45 10*3/uL — AB (ref 150–400)
RBC: 5.03 MIL/uL (ref 4.22–5.81)
RDW: 14.5 % (ref 11.5–15.5)
WBC: 1.8 10*3/uL — ABNORMAL LOW (ref 4.0–10.5)

## 2015-06-26 LAB — COMPREHENSIVE METABOLIC PANEL
ALK PHOS: 103 U/L (ref 40–115)
ALT: 41 U/L (ref 9–46)
AST: 49 U/L — AB (ref 10–40)
Albumin: 3.5 g/dL — ABNORMAL LOW (ref 3.6–5.1)
BILIRUBIN TOTAL: 0.9 mg/dL (ref 0.2–1.2)
BUN: 13 mg/dL (ref 7–25)
CALCIUM: 8.6 mg/dL (ref 8.6–10.3)
CO2: 27 mmol/L (ref 20–31)
Chloride: 103 mmol/L (ref 98–110)
Creat: 0.65 mg/dL (ref 0.60–1.35)
Glucose, Bld: 90 mg/dL (ref 65–99)
Potassium: 4.2 mmol/L (ref 3.5–5.3)
Sodium: 138 mmol/L (ref 135–146)
Total Protein: 7 g/dL (ref 6.1–8.1)

## 2015-06-26 NOTE — Progress Notes (Signed)
   Subjective:    Patient ID: Julian Cox, male    DOB: 17-Mar-1981, 35 y.o.   MRN: [id number]  HPI 35 yo M with hepatitis B dx 2010 when he moved to [place] from [place].  He also has a hx of thrombocytopenia.  He was given 3 months for Hep B when he was in [place]. His sister has Hep B.  He had blood tests done 03-2015 showing S Ag+, eAg-, S Ab-, HIV-.  Denies prior TB tests.  He denies nose or gum bleeds. Has been having throat pain and has been coughing up blood. Small amounts. Had fever during this period (resolved over last 48h). His cough is also resolved.  He had liver u/s 06-14-15-  Heterogeneous echotexture throughout the liver, likely fatty infiltration with area of focal fatty sparing. The past medical history, family history and social history were reviewed/updated in EPIC   Review of Systems  Constitutional: Positive for fever. Negative for chills, appetite change and unexpected weight change.  Respiratory: Negative for cough.   Gastrointestinal: Negative for diarrhea, constipation and blood in stool.  Genitourinary: Negative for difficulty urinating.  Neurological: Negative for headaches.  Hematological: Negative for adenopathy.       Objective:   Physical Exam  Constitutional: He appears well-developed and well-nourished.  HENT:  Mouth/Throat: No oropharyngeal exudate.  Eyes: EOM are normal. Pupils are equal, round, and reactive to light.  Neck: Neck supple.  Cardiovascular: Normal rate, regular rhythm and normal heart sounds.   Pulmonary/Chest: Effort normal and breath sounds normal.  Abdominal: Soft. Bowel sounds are normal. He exhibits no mass. There is no tenderness. There is no rebound.  Musculoskeletal: He exhibits no edema.  Lymphadenopathy:    He has no cervical adenopathy.       Assessment & Plan:

## 2015-06-26 NOTE — Assessment & Plan Note (Signed)
He had minimal elevation in his AST on last blood draw (prev nl) and mild elevation of T bili. His liver u/s does not show cirrhosis.  Will check his Hep B VL/DNA to see if he qualifies for treatment. He has negative Hep e Ag.  Will see him back in 6 weeks.

## 2015-06-27 ENCOUNTER — Telehealth: Payer: Self-pay | Admitting: *Deleted

## 2015-06-27 DIAGNOSIS — D500 Iron deficiency anemia secondary to blood loss (chronic): Secondary | ICD-10-CM | POA: Insufficient documentation

## 2015-06-27 LAB — HEPATITIS B SURFACE ANTIGEN: Hepatitis B Surface Ag: POSITIVE — AB

## 2015-06-27 LAB — HEPATITIS B SURF AG CONFIRMATION: HEPATITIS B SURFACE ANTIGEN CONFIRMATION: POSITIVE — AB

## 2015-06-27 LAB — HEPATITIS B SURFACE ANTIBODY,QUALITATIVE: Hep B S Ab: NEGATIVE

## 2015-06-27 LAB — HEPATITIS B CORE ANTIBODY, TOTAL: Hep B Core Total Ab: REACTIVE — AB

## 2015-06-27 MED ORDER — POLYSACCHARIDE IRON COMPLEX 150 MG PO CAPS: 150.0000 mg | ORAL_CAPSULE | Freq: Every day | ORAL | Status: AC

## 2015-06-27 NOTE — Telephone Encounter (Signed)
ALERT low platelet count = 45, history of low platelets.  [facility]cher [facility]

## 2015-06-27 NOTE — Progress Notes (Signed)
Mild iron deficiency with tendency to microcytosis.  Ferritin 25.  Hemoglobin within normal limits but somewhat downtrending. Plan -we will recommend the patient take over-the-counter Niferex 150 mg oral daily or ferrous sulfate 325 mg by mouth daily.  Stool softeners as needed.

## 2015-06-27 NOTE — Telephone Encounter (Signed)
Called pt per Dr. Candise CheKale to inform him to take ferrous sulfate 325mg , 1 tablet daily by mouth, due to low ferritin.  Can be picked up over the counter.  Pt can also take over the counter stool softener if needed for constipation from iron.  Pt verbalized understanding.

## 2015-06-28 LAB — HEPATITIS B DNA, ULTRAQUANTITATIVE, PCR
Hepatitis B DNA (Calc): <1.3 Log IU/mL (ref <1.30)
Hepatitis B DNA: <20 IU/mL (ref <20)

## 2015-06-28 LAB — HEPATITIS B E ANTIGEN: Hepatitis Be Antigen: NONREACTIVE

## 2015-08-20 ENCOUNTER — Other Ambulatory Visit: Payer: Medicaid Other

## 2015-08-20 ENCOUNTER — Ambulatory Visit: Payer: Medicaid Other | Admitting: Hematology

## 2015-08-21 ENCOUNTER — Other Ambulatory Visit: Payer: Self-pay | Admitting: *Deleted

## 2015-08-21 ENCOUNTER — Telehealth: Payer: Self-pay | Admitting: Hematology

## 2015-08-21 ENCOUNTER — Ambulatory Visit: Payer: Medicaid Other | Admitting: Infectious Diseases

## 2015-08-21 NOTE — Telephone Encounter (Signed)
left msg for pt to call back and resched missed apts that are most convenient w/ his sched

## 2015-10-08 ENCOUNTER — Telehealth: Payer: Self-pay

## 2015-10-08 NOTE — Telephone Encounter (Signed)
Pt called to r/s missed appt of 5/23 lab and [facility] HeavenKale. No answer when tried to connect to scheduler.

## 2015-10-11 ENCOUNTER — Emergency Department (HOSPITAL_COMMUNITY)
Admission: EM | Admit: 2015-10-11 | Discharge: 2015-10-11 | Disposition: A | Discharge status: Home / Self Care | Payer: Medicaid Other | Attending: Emergency Medicine | Admitting: Emergency Medicine

## 2015-10-11 ENCOUNTER — Encounter (HOSPITAL_COMMUNITY): Payer: Self-pay | Admitting: Physical Medicine and Rehabilitation

## 2015-10-11 DIAGNOSIS — S81852A Open bite, left lower leg, initial encounter: Secondary | ICD-10-CM | POA: Insufficient documentation

## 2015-10-11 DIAGNOSIS — Y999 Unspecified external cause status: Secondary | ICD-10-CM | POA: Insufficient documentation

## 2015-10-11 DIAGNOSIS — W548XXA Other contact with dog, initial encounter: Secondary | ICD-10-CM

## 2015-10-11 DIAGNOSIS — Y939 Activity, unspecified: Secondary | ICD-10-CM | POA: Insufficient documentation

## 2015-10-11 DIAGNOSIS — Y929 Unspecified place or not applicable: Secondary | ICD-10-CM | POA: Insufficient documentation

## 2015-10-11 DIAGNOSIS — W540XXA Bitten by dog, initial encounter: Secondary | ICD-10-CM | POA: Insufficient documentation

## 2015-10-11 NOTE — ED Notes (Signed)
Pt reports dog scratches/bites to L lower leg. Incident occurred on Thursday evening. Pt wants to be checked out in ED. No bleeding noted. No open wounds. Denies pain.

## 2015-10-11 NOTE — ED Notes (Signed)
Pt reports dog bite to L lower leg Thursday evening. Pt is unsure if dog is up to date on vaccines. Pt states dog bite did not break skin. Wants to be checked out in ED.

## 2015-10-11 NOTE — ED Provider Notes (Signed)
CSN: [id number]     Arrival date & time 10/11/15  1010 History  By signing my name below, I, Phillis HaggisGabriella Gaje, attest that this documentation has been prepared under the direction and in the presence of [facility] Ward, PA-C. Electronically Signed: Phillis HaggisGabriella Gaje, ED Scribe. 10/11/2015. 11:09 AM.   Chief Complaint  Patient presents with  . Animal Bite   The history is provided by the patient. No language interpreter was used.  HPI Comments: Carolyne Fiscaltem Wal is a 35 y.o. Male with a hx of Hepatitis B and CMV who presents to the Emergency Department complaining of an animal bite to the left lower leg onset one day ago. Pt states that the dog did not break the skin and only resulted in a few scratches. He is unsure of the dog's vaccination status as he was walking on the street when the dog came up to him. The pt was scratched by the dog's teeth. He called animal control after the incident who informed him that no further intervention was needed because there was no break in the skin. Pt reports that his relative had an animal bite related death and was concerned about this incident. He states he is here requesting a second opinion, wanting a medical opinion.  He denies fever, chills, nausea, vomiting, wound, or rash.   Past Medical History  Diagnosis Date  . Hepatitis B     diagnosed and treated in [place] in 2010  . CMV (cytomegalovirus infection) (HCC)   . Thrombocytopenia (HCC)   . Splenomegaly    History reviewed. No pertinent past surgical history. Family History  Problem Relation Age of Onset  . Hepatitis B Sister   . Hepatitis B Sister   . Hepatitis B Cousin    Social History  Substance Use Topics  . Smoking status: Never Smoker   . Smokeless tobacco: None  . Alcohol Use: No    Review of Systems  Constitutional: Negative for fever and chills.  Gastrointestinal: Negative for nausea and vomiting.  Skin: Negative for rash and wound.    Allergies  Review of patient's allergies indicates no  known allergies.  Home Medications   Prior to Admission medications   Medication Sig Start Date End Date Taking? Authorizing Provider  iron polysaccharides (NIFEREX) 150 MG capsule Take 1 capsule (150 mg total) by mouth daily. 06/27/15   Johney MaineGautam Kishore Kale, MD   BP 125/72 mmHg  Pulse 65  Temp(Src) 97.8 F (36.6 C) (Oral)  Resp 18  SpO2 100% Physical Exam  Constitutional: He is oriented to person, place, and time. He appears well-developed and well-nourished.  HENT:  Head: Normocephalic and atraumatic.  Mouth/Throat: Oropharynx is clear and moist.  Neck: Normal range of motion. Neck supple.  Cardiovascular: Normal rate, regular rhythm and normal heart sounds.   Pulmonary/Chest: Effort normal and breath sounds normal. No respiratory distress.  Musculoskeletal: Normal range of motion.  Neurological: He is alert and oriented to person, place, and time.  LLE neurovascularly intact.   Skin: Skin is warm and dry.  Left lower extremity with no tenderness, erythema, warmth, abrasions or breaks in the skin.   Psychiatric: He has a normal mood and affect. His behavior is normal.  Nursing note and vitals reviewed.   ED Course  Procedures (including critical care time) DIAGNOSTIC STUDIES: Oxygen Saturation is 100% on RA, normal by my interpretation.    COORDINATION OF CARE: 11:08 AM-Discussed treatment plan which includes work note with pt at bedside and pt agreed to plan.  Labs Review Labs Reviewed - No data to display  Imaging Review No results found. I have personally reviewed and evaluated these images and lab results as part of my medical decision-making.   EKG Interpretation None      MDM   Final diagnoses:  Other contact with dog, initial encounter   Atem Wal presents to ED after encounter with dog. Patient states a dog came up to him and scratched his LLE with his teeth. Denies any break in the skin. Animal control was called and informed him no further  intervention needed to be done. He is here today seeking second opinion. On exam, LLE is NVI. There is no break in the skin or any signs of trauma. Rabies vaccine not indicated. Educated patient that I agree with animal control that rabies vaccine not indicated. Reassurance provided. Patient satisfied and discharged to home in good condition.   I personally performed the services described in this documentation, which was scribed in my presence. The recorded information has been reviewed and is accurate.    [facility] Ward, PA-C 10/11/15 1123  Derwood Kaplan, MD 10/13/15 1914

## 2015-10-11 NOTE — Discharge Instructions (Signed)
Return to ER as needed for new or worsening symptoms, any additional concerns.

## 2015-12-04 ENCOUNTER — Encounter (HOSPITAL_COMMUNITY): Payer: Self-pay | Admitting: Emergency Medicine

## 2015-12-04 ENCOUNTER — Emergency Department (HOSPITAL_COMMUNITY): Payer: Self-pay

## 2015-12-04 ENCOUNTER — Emergency Department (HOSPITAL_COMMUNITY)
Admission: EM | Admit: 2015-12-04 | Discharge: 2015-12-04 | Disposition: A | Discharge status: Home / Self Care | Payer: Self-pay | Attending: Physician Assistant | Admitting: Physician Assistant

## 2015-12-04 DIAGNOSIS — M25561 Pain in right knee: Secondary | ICD-10-CM

## 2015-12-04 DIAGNOSIS — S80211A Abrasion, right knee, initial encounter: Secondary | ICD-10-CM | POA: Insufficient documentation

## 2015-12-04 DIAGNOSIS — S0990XA Unspecified injury of head, initial encounter: Secondary | ICD-10-CM | POA: Insufficient documentation

## 2015-12-04 DIAGNOSIS — T148XXA Other injury of unspecified body region, initial encounter: Secondary | ICD-10-CM

## 2015-12-04 DIAGNOSIS — Y92410 Unspecified street and highway as the place of occurrence of the external cause: Secondary | ICD-10-CM | POA: Insufficient documentation

## 2015-12-04 DIAGNOSIS — Y939 Activity, unspecified: Secondary | ICD-10-CM | POA: Insufficient documentation

## 2015-12-04 DIAGNOSIS — Y999 Unspecified external cause status: Secondary | ICD-10-CM | POA: Insufficient documentation
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
## 2015-12-12 ENCOUNTER — Ambulatory Visit: Payer: Medicaid Other

## 2016-02-06 ENCOUNTER — Ambulatory Visit: Payer: Medicaid Other

## 2017-11-30 ENCOUNTER — Ambulatory Visit
Admission: RE | Admit: 2017-11-30 | Discharge: 2017-11-30 | Disposition: A | Discharge status: Home / Self Care | Payer: Worker's Compensation | Source: Ambulatory Visit | Attending: Physician Assistant | Admitting: Physician Assistant

## 2017-11-30 ENCOUNTER — Other Ambulatory Visit: Payer: Self-pay | Admitting: Physician Assistant

## 2017-11-30 DIAGNOSIS — S20229A Contusion of unspecified back wall of thorax, initial encounter: Secondary | ICD-10-CM | POA: Insufficient documentation

## 2017-11-30 DIAGNOSIS — S20219A Contusion of unspecified front wall of thorax, initial encounter: Secondary | ICD-10-CM

## 2017-11-30 DIAGNOSIS — R937 Abnormal findings on diagnostic imaging of other parts of musculoskeletal system: Secondary | ICD-10-CM | POA: Diagnosis not present

## 2017-11-30 DIAGNOSIS — X58XXXA Exposure to other specified factors, initial encounter: Secondary | ICD-10-CM | POA: Diagnosis not present

## 2018-08-05 ENCOUNTER — Emergency Department: Payer: Self-pay

## 2018-08-05 ENCOUNTER — Emergency Department
Admission: EM | Admit: 2018-08-05 | Discharge: 2018-08-05 | Disposition: A | Discharge status: Home / Self Care | Payer: Self-pay | Attending: Emergency Medicine | Admitting: Emergency Medicine

## 2018-08-05 DIAGNOSIS — Z79899 Other long term (current) drug therapy: Secondary | ICD-10-CM | POA: Insufficient documentation

## 2018-08-05 DIAGNOSIS — M549 Dorsalgia, unspecified: Secondary | ICD-10-CM | POA: Insufficient documentation

## 2018-08-05 DIAGNOSIS — R0789 Other chest pain: Secondary | ICD-10-CM | POA: Insufficient documentation

## 2018-08-05 MED ORDER — IBUPROFEN 600 MG PO TABS
600.0000 mg | ORAL_TABLET | Freq: Once | ORAL | Status: AC
  Administered 2018-08-05: 600 mg via ORAL
  Filled 2018-08-05: qty 1

## 2018-08-05 NOTE — ED Notes (Signed)
Reviewed discharge instructions, follow-up care, cryotherapy, and OTC medications with patient. Patient verbalized understanding of all information reviewed. Patient stable, with no distress noted at this time.

## 2018-08-05 NOTE — ED Notes (Signed)
ED Provider at bedside. 
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
## 2020-12-29 IMAGING — CR CHEST - 2 VIEW
2 series · 2 of 2 positions shown · non-contrast
Comparison: 11/30/2017

CLINICAL DATA: Chest pain

EXAM:
CHEST - 2 VIEW

[chest pa]
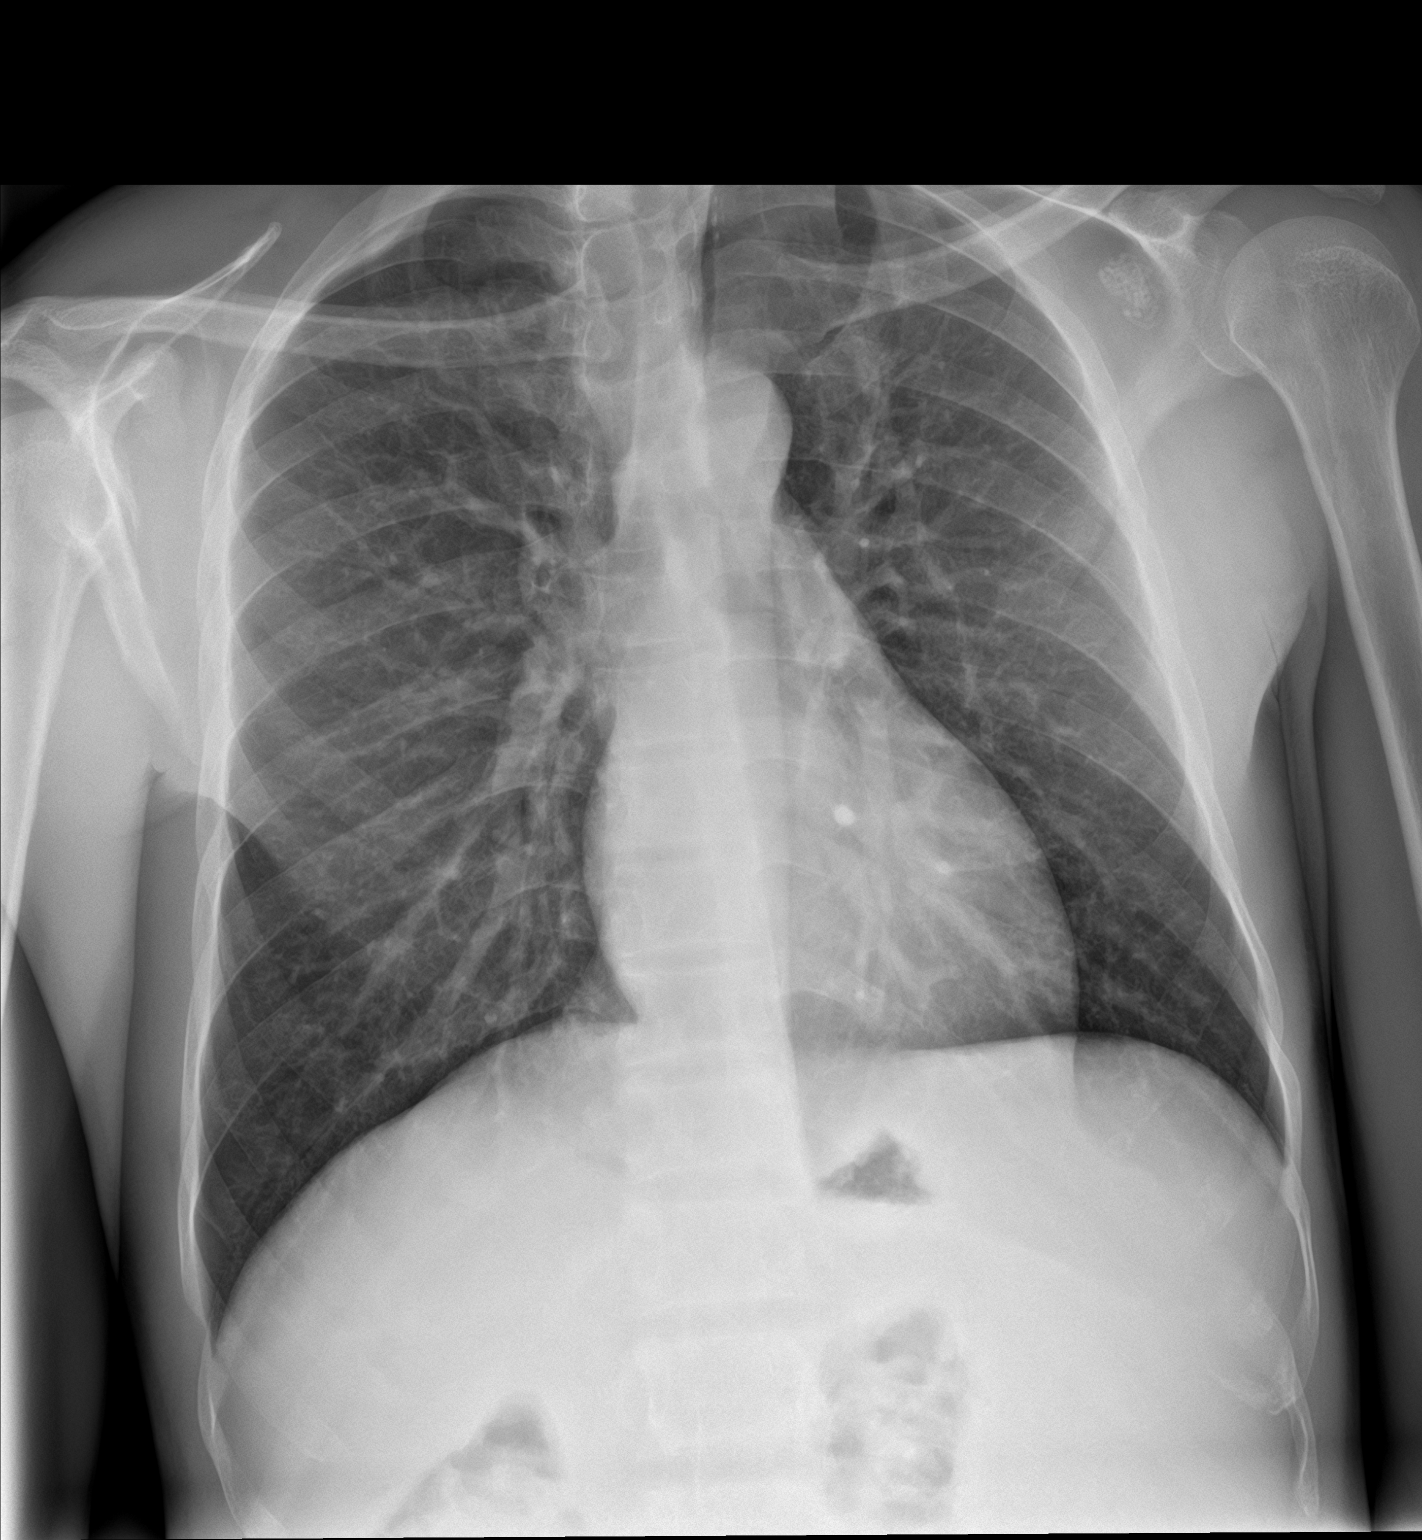

[chest lat]
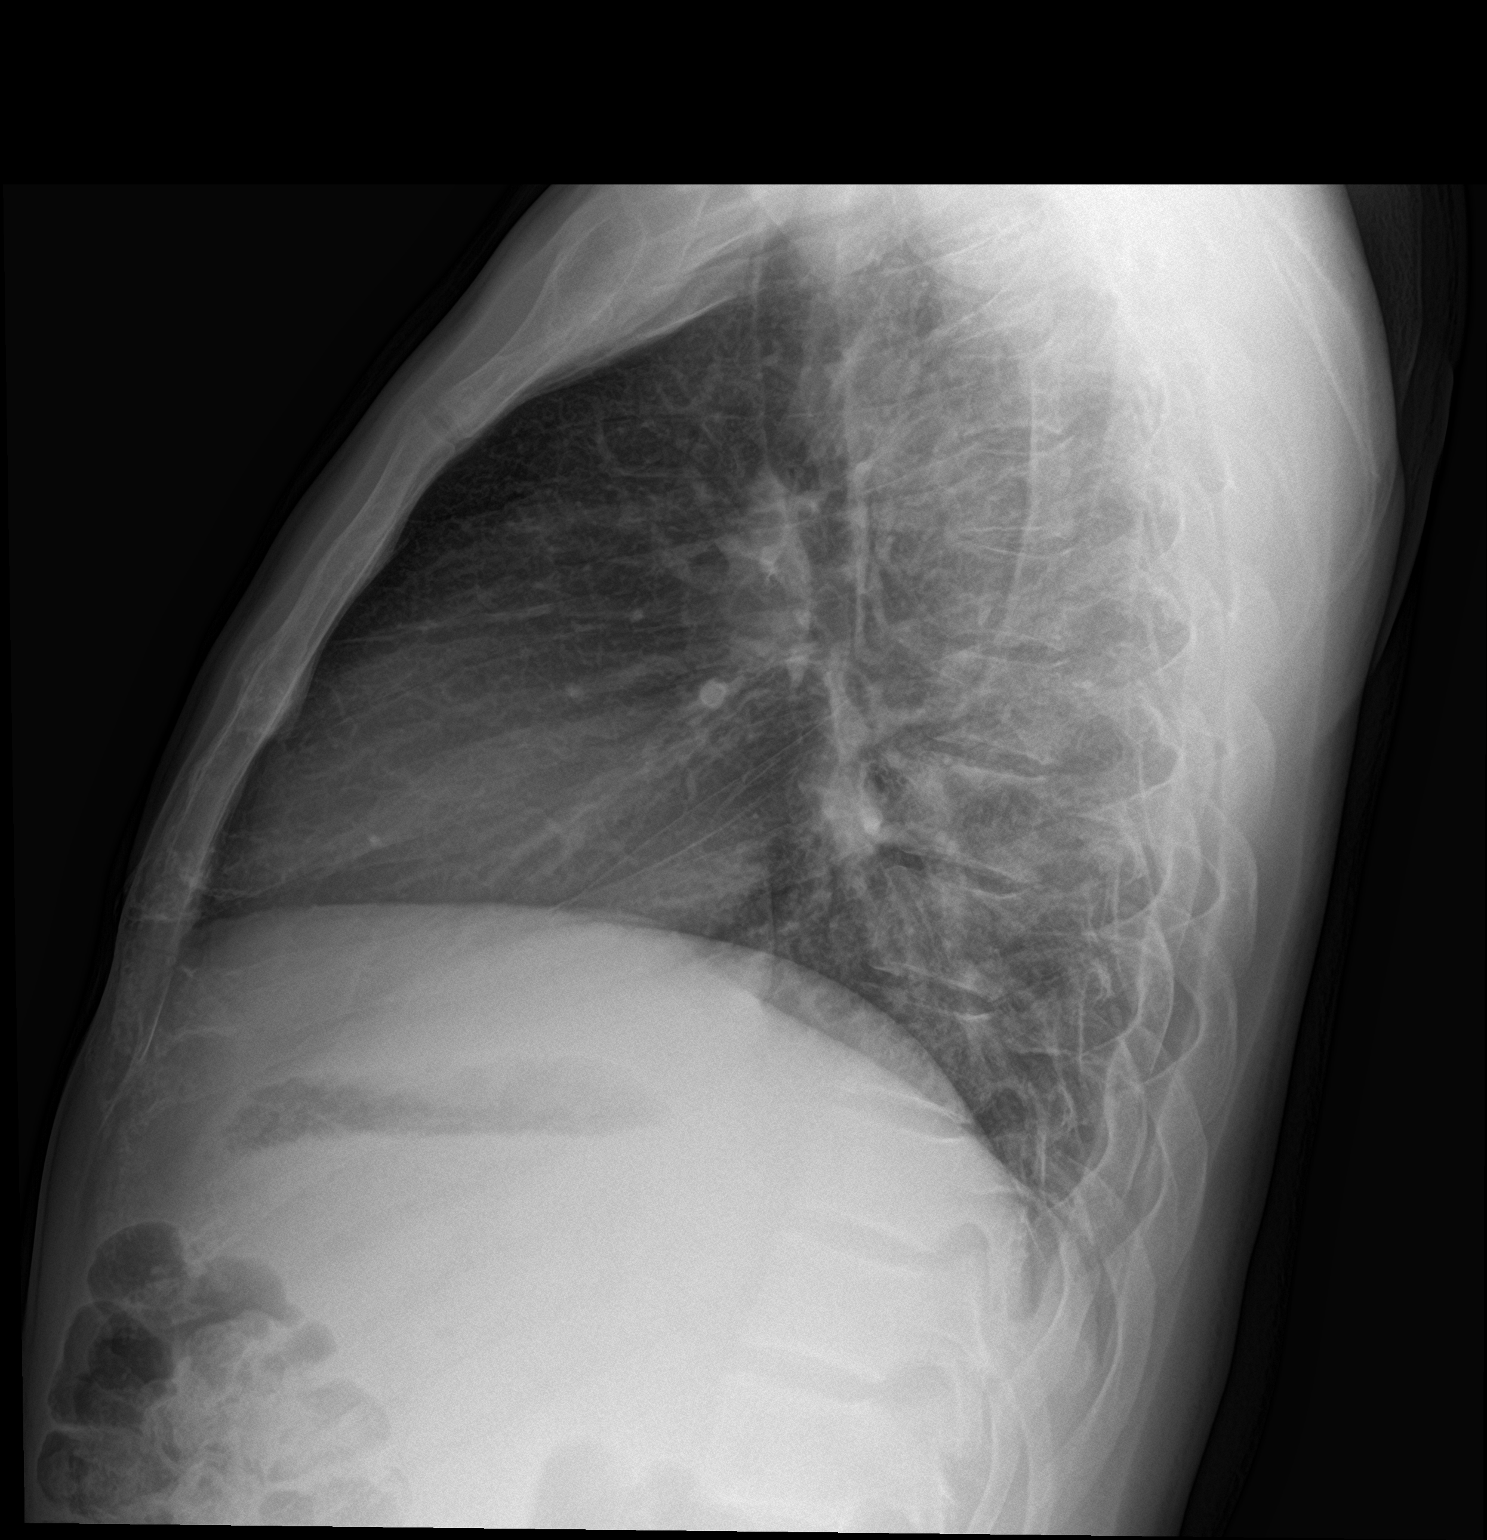

[2 of 2 positions shown; findings below may reference images not displayed]

FINDINGS: Normal heart size and mediastinal contours. No acute infiltrate or
edema. No effusion or pneumothorax. No acute osseous findings.

Calcification projecting over the left scapula favoring calcified
axillary lymph node.
IMPRESSION: Negative.

## 2022-10-03 ENCOUNTER — Emergency Department
Admission: EM | Admit: 2022-10-03 | Discharge: 2022-10-03 | Disposition: A | Discharge status: Home / Self Care | Payer: BLUE CROSS/BLUE SHIELD | Attending: Student in an Organized Health Care Education/Training Program | Admitting: Student in an Organized Health Care Education/Training Program

## 2022-10-03 ENCOUNTER — Emergency Department: Payer: BLUE CROSS/BLUE SHIELD

## 2022-10-03 ENCOUNTER — Other Ambulatory Visit: Payer: Self-pay

## 2022-10-03 DIAGNOSIS — Z5321 Procedure and treatment not carried out due to patient leaving prior to being seen by health care provider: Secondary | ICD-10-CM | POA: Insufficient documentation

## 2022-10-03 DIAGNOSIS — M79661 Pain in right lower leg: Secondary | ICD-10-CM | POA: Insufficient documentation

## 2022-10-03 NOTE — ED Notes (Signed)
Pt came out the room and advised he is leaving, advised pt the doctor will be with him shortly pt cut me off and stated "no I am leaving."

## 2022-10-03 NOTE — ED Notes (Addendum)
RN to speak to patient outside of ER lobby. Patient refused to stay in ER, and stated "it's 10 o'clock and now I have to walk in the dark." Patient refused speaking with provider.
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
